# Patient Record
Sex: Female | Born: 1995 | Race: Black or African American | Hispanic: No | Marital: Single | Smoking: Never smoker
Health system: Southern US, Community
[De-identification: ages and names within clinical notes are randomized; demographics above are authoritative.]

---
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
## 2015-01-10 ENCOUNTER — Emergency Department (HOSPITAL_COMMUNITY)
Admission: EM | Admit: 2015-01-10 | Discharge: 2015-01-11 | Disposition: A | Discharge status: Home / Self Care | Payer: Medicaid Other | Attending: Emergency Medicine | Admitting: Emergency Medicine

## 2015-01-10 ENCOUNTER — Emergency Department (HOSPITAL_COMMUNITY): Payer: Medicaid Other

## 2015-01-10 ENCOUNTER — Encounter (HOSPITAL_COMMUNITY): Payer: Self-pay | Admitting: *Deleted

## 2015-01-10 DIAGNOSIS — Z791 Long term (current) use of non-steroidal anti-inflammatories (NSAID): Secondary | ICD-10-CM | POA: Insufficient documentation

## 2015-01-10 DIAGNOSIS — Y92410 Unspecified street and highway as the place of occurrence of the external cause: Secondary | ICD-10-CM | POA: Insufficient documentation

## 2015-01-10 DIAGNOSIS — S59911A Unspecified injury of right forearm, initial encounter: Secondary | ICD-10-CM | POA: Insufficient documentation

## 2015-01-10 DIAGNOSIS — Y998 Other external cause status: Secondary | ICD-10-CM | POA: Insufficient documentation

## 2015-01-10 DIAGNOSIS — R202 Paresthesia of skin: Secondary | ICD-10-CM | POA: Diagnosis not present

## 2015-01-10 DIAGNOSIS — Y9389 Activity, other specified: Secondary | ICD-10-CM | POA: Diagnosis not present

## 2015-01-10 DIAGNOSIS — S6991XA Unspecified injury of right wrist, hand and finger(s), initial encounter: Secondary | ICD-10-CM | POA: Insufficient documentation

## 2015-01-10 MED ORDER — OXYCODONE-ACETAMINOPHEN 5-325 MG PO TABS
1.0000 | ORAL_TABLET | Freq: Once | ORAL | Status: AC
  Administered 2015-01-10: 1 via ORAL
  Filled 2015-01-10: qty 1
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
## 2015-01-11 ENCOUNTER — Emergency Department (HOSPITAL_COMMUNITY): Payer: Medicaid Other

## 2015-01-11 DIAGNOSIS — M79601 Pain in right arm: Secondary | ICD-10-CM

## 2015-01-11 MED ORDER — CYCLOBENZAPRINE HCL 10 MG PO TABS: 10.0000 mg | ORAL_TABLET | Freq: Two times a day (BID) | ORAL | Status: AC | PRN

## 2015-01-11 MED ORDER — HYDROCODONE-ACETAMINOPHEN 5-325 MG PO TABS: 1.0000 | ORAL_TABLET | Freq: Four times a day (QID) | ORAL | Status: AC | PRN

## 2015-01-11 MED ORDER — NAPROXEN 500 MG PO TABS: 500.0000 mg | ORAL_TABLET | Freq: Two times a day (BID) | ORAL | Status: AC
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
## 2015-01-11 NOTE — ED Notes (Signed)
Neurology at bedside.
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
## 2015-09-30 ENCOUNTER — Ambulatory Visit: Payer: Medicaid Other | Admitting: Obstetrics & Gynecology

## 2015-10-05 ENCOUNTER — Encounter: Payer: Self-pay | Admitting: *Deleted
